# Patient Record
Sex: Male | Born: 1980 | Race: Black or African American | Hispanic: No | Marital: Single | Smoking: Never smoker
Health system: Southern US, Community
[De-identification: ages and names within clinical notes are randomized; demographics above are authoritative.]

## PROBLEM LIST (undated history)

## (undated) DIAGNOSIS — J45909 Unspecified asthma, uncomplicated: Secondary | ICD-10-CM

## (undated) HISTORY — DX: Unspecified asthma, uncomplicated: J45.909

## (undated) HISTORY — PX: TONSILLECTOMY: SUR1361

## (undated) HISTORY — PX: EYE SURGERY: SHX253

---

## 2000-10-20 ENCOUNTER — Emergency Department (HOSPITAL_COMMUNITY): Admission: EM | Admit: 2000-10-20 | Discharge: 2000-10-20 | Payer: Self-pay | Admitting: Emergency Medicine

## 2012-04-02 ENCOUNTER — Ambulatory Visit: Payer: Self-pay | Admitting: Physician Assistant

## 2013-10-21 ENCOUNTER — Other Ambulatory Visit: Payer: Self-pay

## 2013-10-21 ENCOUNTER — Encounter (HOSPITAL_COMMUNITY): Payer: Self-pay | Admitting: Emergency Medicine

## 2013-10-21 ENCOUNTER — Emergency Department (HOSPITAL_COMMUNITY): Payer: Non-veteran care

## 2013-10-21 ENCOUNTER — Emergency Department (HOSPITAL_COMMUNITY)
Admission: EM | Admit: 2013-10-21 | Discharge: 2013-10-21 | Disposition: A | Discharge status: Home / Self Care | Payer: Non-veteran care | Attending: Emergency Medicine | Admitting: Emergency Medicine

## 2013-10-21 DIAGNOSIS — R0789 Other chest pain: Secondary | ICD-10-CM

## 2013-10-21 LAB — I-STAT CHEM 8, ED
BUN: 8 mg/dL (ref 6–23)
Calcium, Ion: 1.16 mmol/L (ref 1.12–1.23)
Chloride: 101 mEq/L (ref 96–112)
Creatinine, Ser: 1.3 mg/dL (ref 0.50–1.35)
Glucose, Bld: 98 mg/dL (ref 70–99)
HEMATOCRIT: 44 % (ref 39.0–52.0)
HEMOGLOBIN: 15 g/dL (ref 13.0–17.0)
POTASSIUM: 3.7 meq/L (ref 3.7–5.3)
Sodium: 141 mEq/L (ref 137–147)
TCO2: 28 mmol/L (ref 0–100)

## 2013-10-21 LAB — I-STAT TROPONIN, ED: Troponin i, poc: 0 ng/mL (ref 0.00–0.08)

## 2013-10-21 NOTE — ED Notes (Signed)
Pt c/o right sided non radiating sharp chest pain, onset two hours ago. Pt denies any associated symptoms with the pain. Pt was sitting down when chest pain occurs. Pt A&Ox4, respirations equal and unlabored, skin warm and dry

## 2013-10-21 NOTE — ED Provider Notes (Signed)
CSN: [id number]     Arrival date & time 10/21/13  0031 History   First MD Initiated Contact with Patient 10/21/13 [phone]     Chief Complaint  Patient presents with  . Chest Pain     (Consider location/radiation/quality/duration/timing/severity/associated sxs/prior Treatment) HPI This patient is a generally healthy 33 year old man who had a self-limited episode of right-sided chest pain after he drank a bottle of magnesium citrate to induce a bowel movement. The pain was sharp, aching and radiated to the back. It began shortly before he came to the emergency department and resolved shortly after he was triaged. Nothing seemed to make the pain worse or better. At its worst, the pain was 7 on a 0-to-10 scale. The patient is pain free at this time. no history of similar pain.  The patient denies pleuritic pain. He has not had any cough or shortness of breath or fever. No abdominal pain, nausea or vomiting. He did have a large bowel movement following magnesium citrate.   History reviewed. No pertinent past medical history. History reviewed. No pertinent past surgical history. History reviewed. No pertinent family history. History  Substance Use Topics  . Smoking status: Never Smoker   . Smokeless tobacco: Not on file  . Alcohol Use: No    Review of Systems Ten point review of symptoms performed and is negative with the exception of symptoms noted above.    Allergies  Shellfish allergy  Home Medications   Prior to Admission medications   Not on File   BP 122/71  Pulse 53  Temp(Src) 98.3 F (36.8 C) (Oral)  Resp 18  Ht 5\' 6"  (1.676 m)  Wt 165 lb 12.8 oz (75.206 kg)  BMI 26.77 kg/m2  SpO2 100% Physical Exam Gen: well developed and well nourished appearing Head: NCAT Eyes: PERL, EOMI Nose: no epistaixis or rhinorrhea Mouth/throat: mucosa is moist and pink Neck: supple, no stridor Chest wall: nontender, no crepitus, normal to inspection.  Lungs: CTA B, no wheezing, rhonchi  or rales CV: RRR, no murmur, extremities appear well perfused.  Abd: soft, notender, nondistended Back: no ttp, no cva ttp Skin: warm and dry Ext: normal to inspection, no dependent edema Neuro: CN ii-xii grossly intact, no focal deficits Psyche; normal affect,  calm and cooperative.   ED Course  Procedures (including critical care time) Labs Review  Results for orders placed during the hospital encounter of 10/21/13 (from the past 24 hour(s))  I-STAT TROPOININ, ED     Status: None   Collection Time    10/21/13 12:54 AM      Result Value Ref Range   Troponin i, poc 0.00  0.00 - 0.08 ng/mL   Comment 3           I-STAT CHEM 8, ED     Status: None   Collection Time    10/21/13 12:57 AM      Result Value Ref Range   Sodium 141  137 - 147 mEq/L   Potassium 3.7  3.7 - 5.3 mEq/L   Chloride 101  96 - 112 mEq/L   BUN 8  6 - 23 mg/dL   Creatinine, Ser 4.131.30  0.50 - 1.35 mg/dL   Glucose, Bld 98  70 - 99 mg/dL   Calcium, Ion 2.441.16  0.101.12 - 1.23 mmol/L   TCO2 28  0 - 100 mmol/L   Hemoglobin 15.0  13.0 - 17.0 g/dL   HCT 27.244.0  53.639.0 - 64.452.0 %     Imaging Review  Dg Chest 2 View  10/21/2013   CLINICAL DATA:  Right chest pain.  EXAM: CHEST  2 VIEW  COMPARISON:  None.  FINDINGS: The heart size and mediastinal contours are within normal limits. Both lungs are clear. The visualized skeletal structures are unremarkable.  No pneumothorax.  IMPRESSION: No active cardiopulmonary disease.   Electronically Signed   By: Charlett NoseKevin  Dover M.D.   On: 10/21/2013 01:27    EKG: nsr, no acute ischemic changes, normal intervals, normal axis, normal qrs complex  MDM   Patient s/p self limited episode of atypical chest pain. VS wnl on monitor and patient has been pain free for the past several hours he has been waiting to be seen. We have ruled out ptx, pleural effusion and pna with CXR. Patient is stable for d/c with referral for outpatient f/u.     Brandt LoosenJulie Manly, MD 10/21/13 [phone]

## 2014-03-09 ENCOUNTER — Ambulatory Visit (INDEPENDENT_AMBULATORY_CARE_PROVIDER_SITE_OTHER): Payer: BC Managed Care – PPO | Admitting: Physician Assistant

## 2014-03-09 VITALS — BP 114/74 | HR 55 | Temp 98.0°F | Resp 20 | Ht 66.0 in | Wt 167.6 lb

## 2014-03-09 DIAGNOSIS — Z113 Encounter for screening for infections with a predominantly sexual mode of transmission: Secondary | ICD-10-CM

## 2014-03-09 DIAGNOSIS — Z23 Encounter for immunization: Secondary | ICD-10-CM

## 2014-03-09 DIAGNOSIS — R300 Dysuria: Secondary | ICD-10-CM

## 2014-03-09 LAB — POCT URINALYSIS DIPSTICK
Bilirubin, UA: NEGATIVE
GLUCOSE UA: NEGATIVE
Ketones, UA: NEGATIVE
LEUKOCYTES UA: NEGATIVE
Nitrite, UA: NEGATIVE
Protein, UA: NEGATIVE
SPEC GRAV UA: 1.01
UROBILINOGEN UA: 0.2
pH, UA: 7.5

## 2014-03-09 LAB — POCT UA - MICROSCOPIC ONLY
Bacteria, U Microscopic: NEGATIVE
Casts, Ur, LPF, POC: NEGATIVE
Crystals, Ur, HPF, POC: NEGATIVE
Mucus, UA: NEGATIVE
WBC, Ur, HPF, POC: NEGATIVE
Yeast, UA: NEGATIVE

## 2014-03-09 NOTE — Patient Instructions (Addendum)
I will contact you with your lab results as soon as they are available.   If you have not heard from me in 2 weeks, please contact me.  The fastest way to get your results is to register for My Chart (see the instructions on the last page of this printout).   Influenza Vaccine (Flu Vaccine, Inactivated or Recombinant) 2014-2015: What You Need to Know 1. Why get vaccinated? Influenza ("flu") is a contagious disease that spreads around the [place] every winter, usually between October and May. Flu is caused by influenza viruses, and is spread mainly by coughing, sneezing, and close contact. Anyone can get flu, but the risk of getting flu is highest among children. Symptoms come on suddenly and may last several days. They can include:  fever/chills  sore throat  muscle aches  fatigue  cough  headache  runny or stuffy nose Flu can make some people much sicker than others. These people include young children, people 65 and older, pregnant women, and people with certain health conditions-such as heart, lung or kidney disease, nervous system disorders, or a weakened immune system. Flu vaccination is especially important for these people, and anyone in close contact with them. Flu can also lead to pneumonia, and make existing medical conditions worse. It can cause diarrhea and seizures in children. Each year thousands of people in the [place] die from flu, and many more are hospitalized. Flu vaccine is the best protection against flu and its complications. Flu vaccine also helps prevent spreading flu from person to person. 2. Inactivated and recombinant flu vaccines You are getting an injectable flu vaccine, which is either an "inactivated" or "recombinant" vaccine. These vaccines do not contain any live influenza virus. They are given by injection with a needle, and often called the "flu shot."  A different live, attenuated (weakened) influenza vaccine is sprayed into the nostrils.  This vaccine is described in a separate Vaccine Information Statement. Flu vaccination is recommended every year. Some children 6 months through 8 years of age might need two doses during one year. Flu viruses are always changing. Each year's flu vaccine is made to protect against 3 or 4 viruses that are likely to cause disease that year. Flu vaccine cannot prevent all cases of flu, but it is the best defense against the disease.  It takes about 2 weeks for protection to develop after the vaccination, and protection lasts several months to a year. Some illnesses that are not caused by influenza virus are often mistaken for flu. Flu vaccine will not prevent these illnesses. It can only prevent influenza. Some inactivated flu vaccine contains a very small amount of a mercury-based preservative called thimerosal. Studies have shown that thimerosal in vaccines is not harmful, but flu vaccines that do not contain a preservative are available. 3. Some people should not get this vaccine Tell the person who gives you the vaccine:  If you have any severe, life-threatening allergies. If you ever had a life-threatening allergic reaction after a dose of flu vaccine, or have a severe allergy to any part of this vaccine, including (for example) an allergy to gelatin, antibiotics, or eggs, you may be advised not to get vaccinated. Most, but not all, types of flu vaccine contain a small amount of egg protein.  If you ever had Guillain-Barr Syndrome (a severe paralyzing illness, also called GBS). Some people with a history of GBS should not get this vaccine. This should be discussed with your doctor.  If you   are not feeling well. It is usually okay to get flu vaccine when you have a mild illness, but you might be advised to wait until you feel better. You should come back when you are better. 4. Risks of a vaccine reaction With a vaccine, like any medicine, there is a chance of side effects. These are usually mild  and go away on their own. Problems that could happen after any vaccine:  Brief fainting spells can happen after any medical procedure, including vaccination. Sitting or lying down for about 15 minutes can help prevent fainting, and injuries caused by a fall. Tell your doctor if you feel dizzy, or have vision changes or ringing in the ears.  Severe shoulder pain and reduced range of motion in the arm where a shot was given can happen, very rarely, after a vaccination.  Severe allergic reactions from a vaccine are very rare, estimated at less than 1 in a million doses. If one were to occur, it would usually be within a few minutes to a few hours after the vaccination. Mild problems following inactivated flu vaccine:  soreness, redness, or swelling where the shot was given  hoarseness  sore, red or itchy eyes  cough  fever  aches  headache  itching  fatigue If these problems occur, they usually begin soon after the shot and last 1 or 2 days. Moderate problems following inactivated flu vaccine:  Young children who get inactivated flu vaccine and pneumococcal vaccine (PCV13) at the same time may be at increased risk for seizures caused by fever. Ask your doctor for more information. Tell your doctor if a child who is getting flu vaccine has ever had a seizure. Inactivated flu vaccine does not contain live flu virus, so you cannot get the flu from this vaccine. As with any medicine, there is a very remote chance of a vaccine causing a serious injury or death. The safety of vaccines is always being monitored. For more information, visit: www.cdc.gov/vaccinesafety/ 5. What if there is a serious reaction? What should I look for?  Look for anything that concerns you, such as signs of a severe allergic reaction, very high fever, or behavior changes. Signs of a severe allergic reaction can include hives, swelling of the face and throat, difficulty breathing, a fast heartbeat, dizziness, and  weakness. These would start a few minutes to a few hours after the vaccination. What should I do?  If you think it is a severe allergic reaction or other emergency that can't wait, call 9-1-1 and get the person to the nearest hospital. Otherwise, call your doctor.  Afterward, the reaction should be reported to the Vaccine Adverse Event Reporting System (VAERS). Your doctor should file this report, or you can do it yourself through the VAERS web site at www.vaers.hhs.gov, or by calling 1-800-822-7967. VAERS does not give medical advice. 6. The National Vaccine Injury Compensation Program The National Vaccine Injury Compensation Program (VICP) is a federal program that was created to compensate people who may have been injured by certain vaccines. Persons who believe they may have been injured by a vaccine can learn about the program and about filing a claim by calling 1-800-338-2382 or visiting the VICP website at www.hrsa.gov/vaccinecompensation. There is a time limit to file a claim for compensation. 7. How can I learn more?  Ask your health care provider.  Call your local or state health department.  Contact the Centers for Disease Control and Prevention (CDC):  Call 1-800-232-4636 (1-800-CDC-INFO) or  Visit   CDC's website at www.cdc.gov/flu CDC Vaccine Information Statement (Interim) Inactivated Influenza Vaccine (02/20/2013) Document Released: 04/15/2006 Document Revised: 11/05/2013 Document Reviewed: 06/08/2013 ExitCare Patient Information 2015 [employer]. This information is not intended to replace advice given to you by your health care provider. Make sure you discuss any questions you have with your health care provider.  

## 2014-03-09 NOTE — Progress Notes (Signed)
Subjective:    Patient ID: Keith Buchanan, male    DOB: [birth year], 33 y.o.   MRN: [id number]   PCP: No primary provider on file.  Chief Complaint  Patient presents with  . Burning with urination    X 2-3 days      Active Ambulatory Problems    Diagnosis Date Noted  . No Active Ambulatory Problems   Resolved Ambulatory Problems    Diagnosis Date Noted  . No Resolved Ambulatory Problems   Past Medical History  Diagnosis Date  . Asthma     Past Surgical History  Procedure Laterality Date  . Eye surgery    . Tonsillectomy      Allergies  Allergen Reactions  . Shellfish Allergy Swelling    Facial sweling    Prior to Admission medications   Not on File    History   Social History  . Marital Status: Single    Spouse Name: n/a    Number of Children: 4  . Years of Education: 15   Occupational History  . [employer] doctor    Social History Main Topics  . Smoking status: Never Smoker   . Smokeless tobacco: Never Used  . Alcohol Use: Yes     Comment: 1-2 times/month  . Drug Use: No  . Sexual Activity: Yes    Partners: Female   Other Topics Concern  . None   Social History Narrative   Lives his girlfriend. Two children live in [place], one son lives in [place], one son lives in [place].    family history includes Asthma in his son; Cancer in his maternal grandfather; Diabetes in his father, maternal grandmother, and mother; Heart disease in his paternal grandfather; Hyperlipidemia in his father; Stroke in his paternal grandmother. indicated that his mother is alive. He indicated that his father is alive. He indicated that his daughter is alive. He indicated that all of his three sons are alive.   HPI  This 33 y.o. male presents for evaluation of dysuria x 2-3 days. "I know it ain't no damn disease." Accidentally used conditioner instead of lotion for lubrication during masturbation, and then developed a burning sensation at the very tip of the penis. The  symptoms resolved yesterday. No urinary urgency or frequency. No blood in the urine. No penile discharge, swollen or tender lymph nodes, skin lesions of the genitalia, groin, buttocks or upper thighs. His girlfriend is not having any symptoms, though recently had a UTI. She is here today for a follow-up visit. Monogamous. He was last tested for STI about a year ago. No history of STI.  Review of Systems As above.    Objective:   Physical Exam  Constitutional: He is oriented to person, place, and time. He appears well-developed and well-nourished. No distress.  Eyes: Conjunctivae are normal. No scleral icterus.  Neck: Neck supple. No thyromegaly present.  Cardiovascular: Normal rate, regular rhythm and normal heart sounds.   Pulmonary/Chest: Effort normal and breath sounds normal.  Lymphadenopathy:    He has no cervical adenopathy.  Neurological: He is alert and oriented to person, place, and time.  Skin: Skin is warm and dry.  Psychiatric: He has a normal mood and affect. His speech is normal and behavior is normal.   Results for orders placed in visit on 03/09/14  POCT UA - MICROSCOPIC ONLY      Result Value Ref Range   WBC, Ur, HPF, POC neg     RBC, urine, microscopic 0-1  Bacteria, U Microscopic neg     Mucus, UA neg     Epithelial cells, urine per micros 0-1     Crystals, Ur, HPF, POC neg     Casts, Ur, LPF, POC neg     Yeast, UA neg    POCT URINALYSIS DIPSTICK      Result Value Ref Range   Color, UA yellow     Clarity, UA clear     Glucose, UA neg     Bilirubin, UA neg     Ketones, UA neg     Spec Grav, UA 1.010     Blood, UA trace-intact     pH, UA 7.5     Protein, UA neg     Urobilinogen, UA 0.2     Nitrite, UA neg     Leukocytes, UA Negative            Assessment & Plan:  1. Dysuria No evidence of UTI. Await GC/CT results. - POCT UA - Microscopic Only - POCT urinalysis dipstick - GC/Chlamydia Probe Amp  2. Routine screening for STI (sexually  transmitted infection) - Hepatitis B surface antigen - Hepatitis C antibody - Hepatitis B surface antibody - HSV(herpes simplex vrs) 1+2 ab-IgG - HIV antibody - RPR  3. Need for influenza vaccination - Flu Vaccine QUAD 36+ mos IM   Fernande Bras, PA-C Physician Assistant-Certified [facility]

## 2014-03-10 LAB — RPR

## 2014-03-10 LAB — HIV ANTIBODY (ROUTINE TESTING W REFLEX): HIV 1&2 Ab, 4th Generation: NONREACTIVE

## 2014-03-10 LAB — HEPATITIS B SURFACE ANTIBODY, QUANTITATIVE: Hepatitis B-Post: 53.7 m[IU]/mL

## 2014-03-10 LAB — HEPATITIS B SURFACE ANTIGEN: HEP B S AG: NEGATIVE

## 2014-03-10 LAB — HEPATITIS C ANTIBODY: HCV AB: NEGATIVE

## 2014-03-11 ENCOUNTER — Encounter: Payer: Self-pay | Admitting: Physician Assistant

## 2014-03-11 DIAGNOSIS — Z789 Other specified health status: Secondary | ICD-10-CM | POA: Insufficient documentation

## 2014-03-12 LAB — HSV(HERPES SIMPLEX VRS) I + II AB-IGG
HSV 1 GLYCOPROTEIN G AB, IGG: 3.94 IV — AB
HSV 2 Glycoprotein G Ab, IgG: <0.1 IV

## 2014-03-13 LAB — GC/CHLAMYDIA PROBE AMP
CT Probe RNA: NEGATIVE
GC Probe RNA: NEGATIVE

## 2014-03-26 IMAGING — CR DG KNEE COMPLETE 4+V*R*
1 series · 5 of 5 positions shown · non-contrast
Comparison: none

REASON FOR EXAM: R knee pain after fall
COMMENTS:

PROCEDURE:     MDR - MDR KNEE RT COMPLETE W/OBLIQUES  - April 02, 2012  [DATE]
RESULT:     Comparison:  None

[Series 1: ap · 0.17mm/px · 5 of 5 slices shown]
[im 1/5]
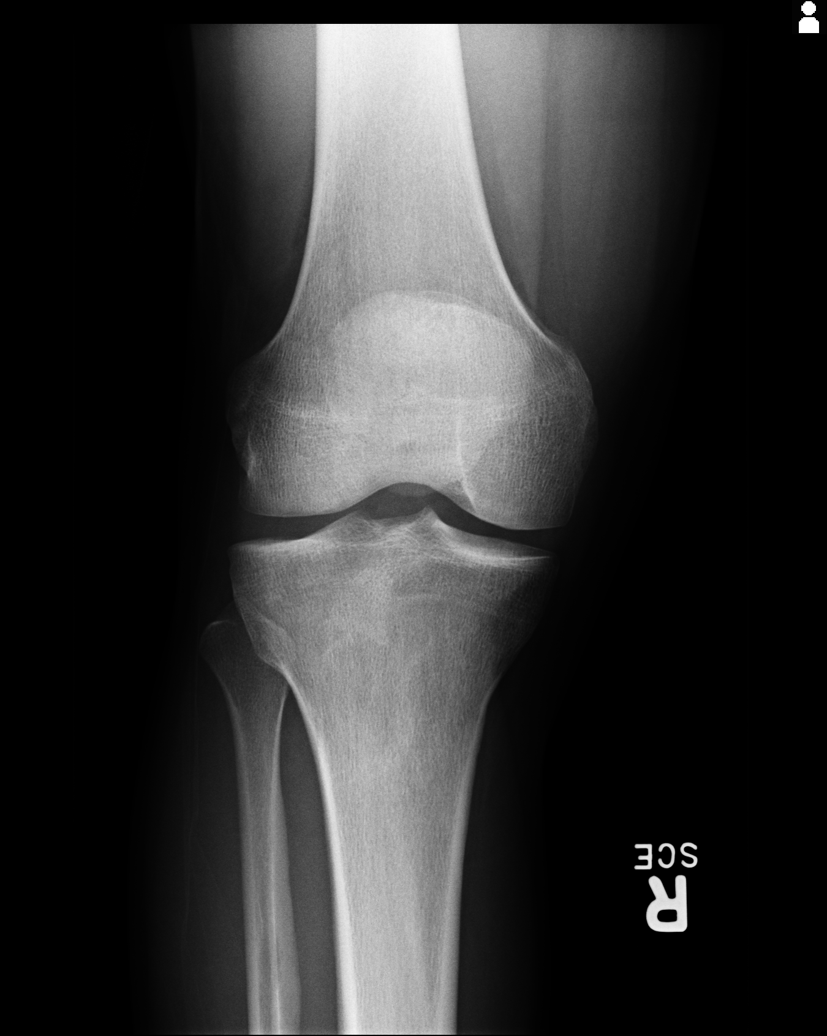
[im 2/5]
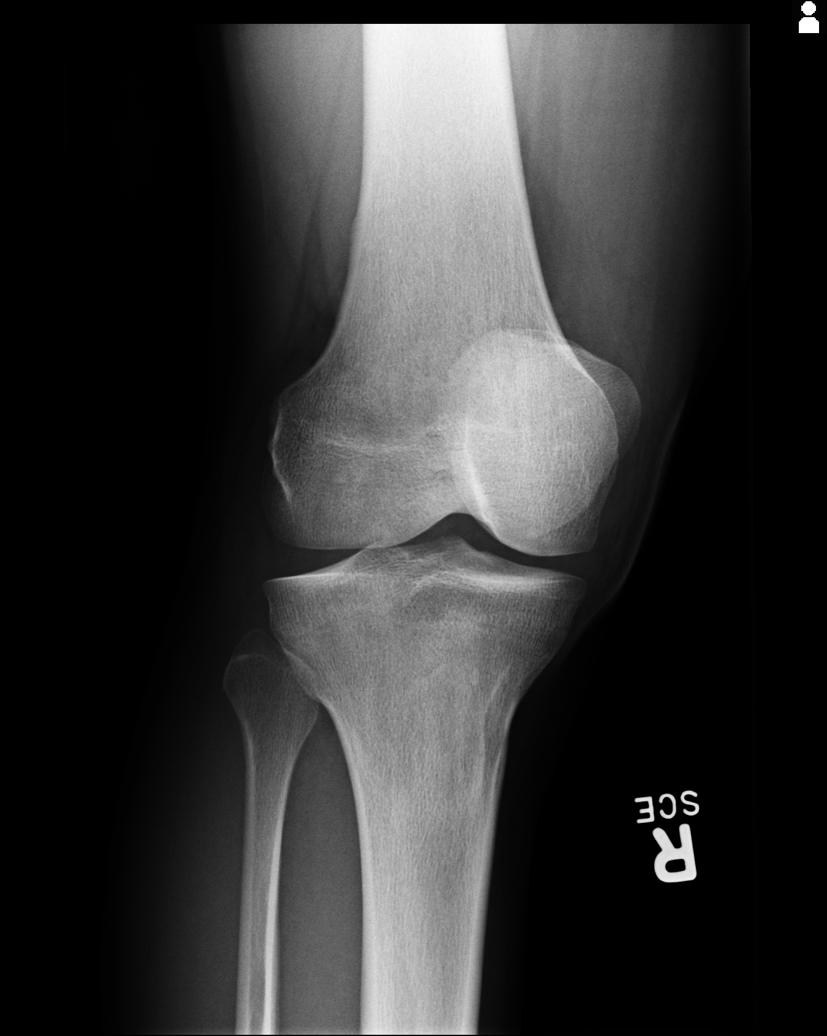
[im 3/5]
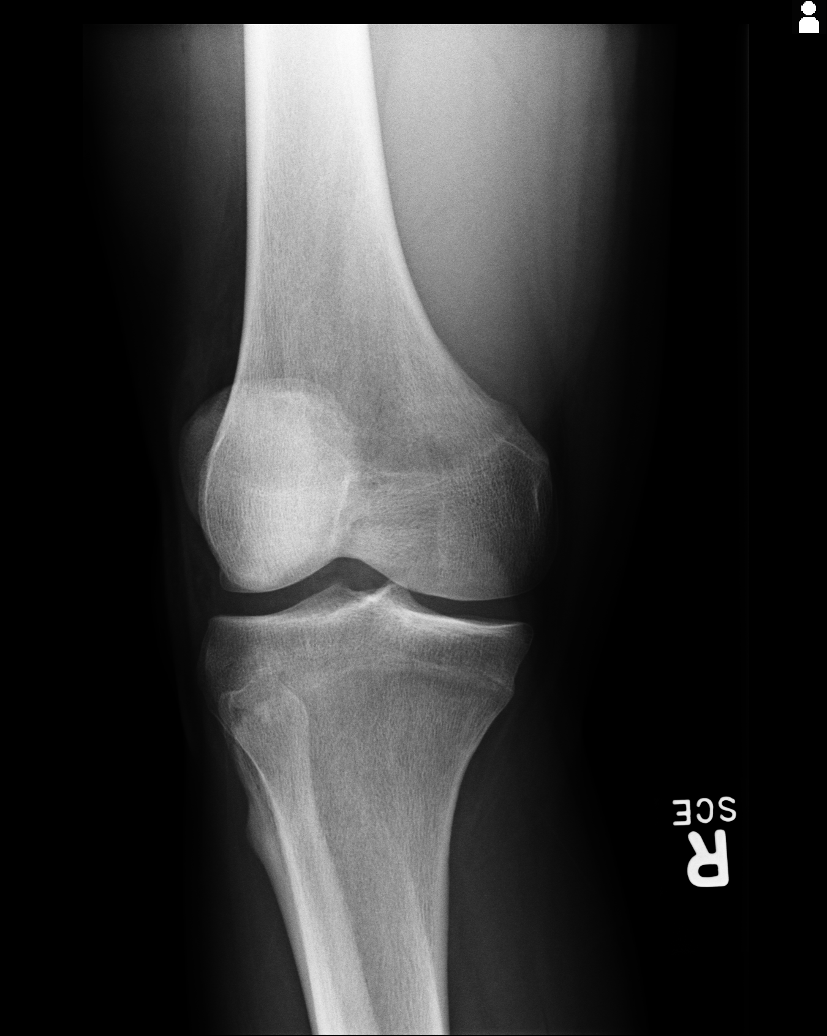
[im 4/5]
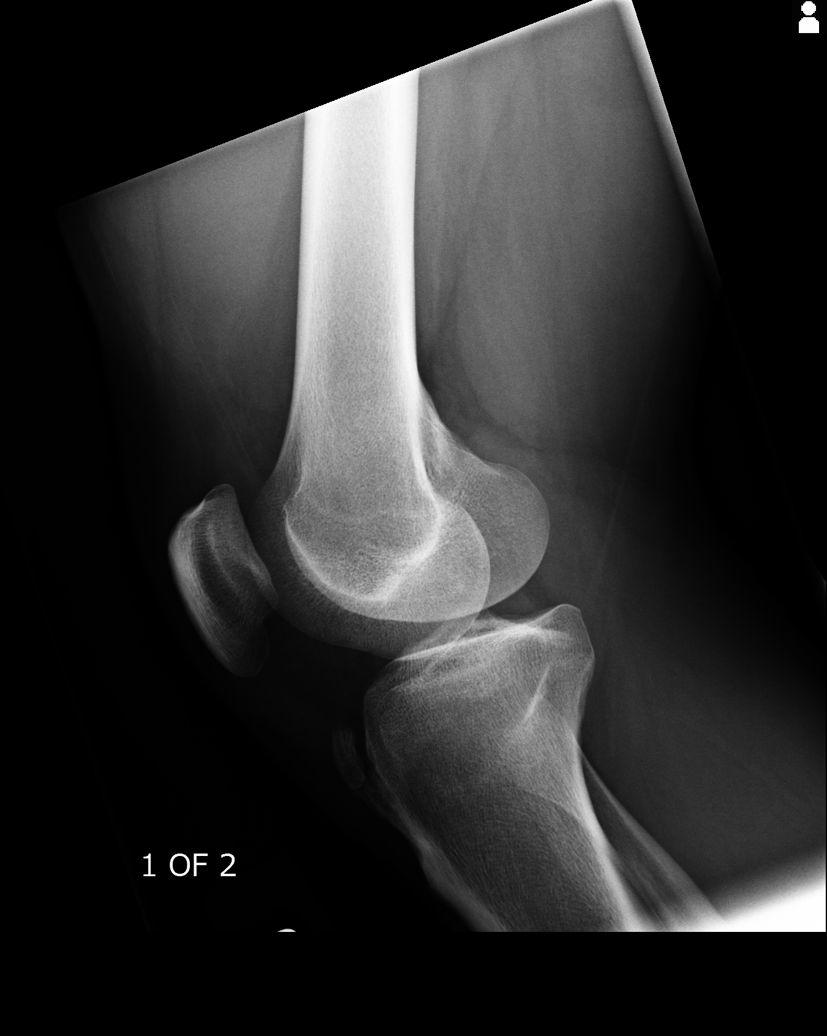
[im 5/5]
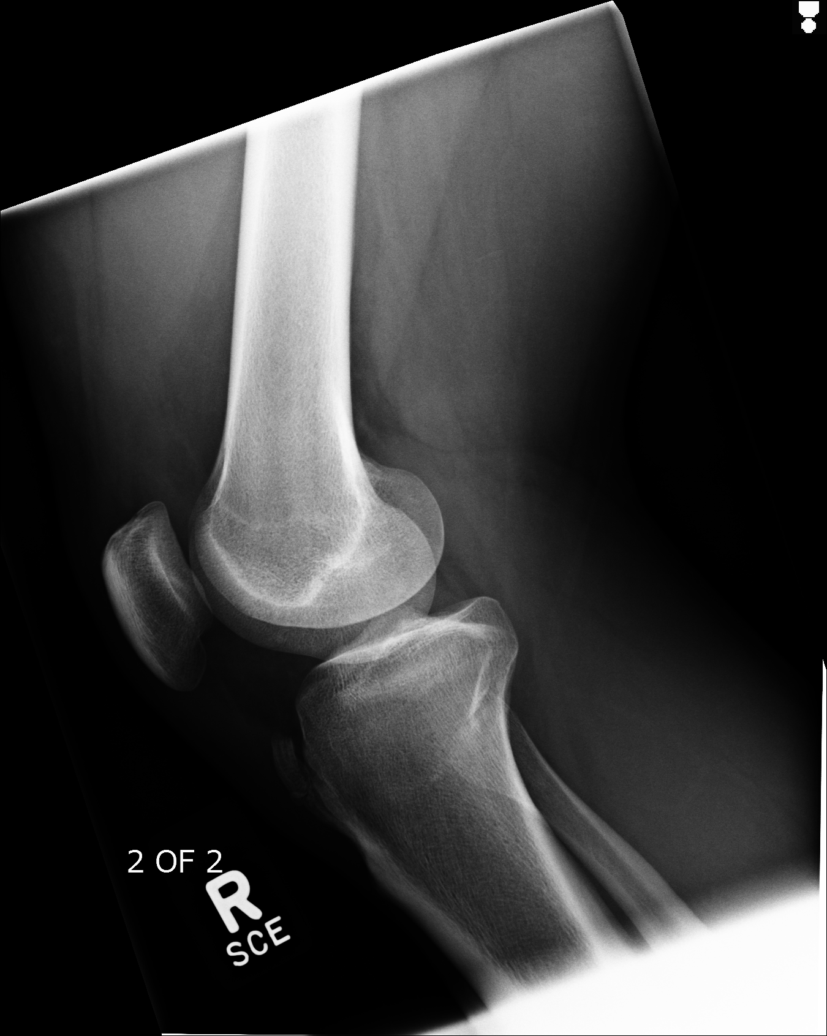

[5 of 5 positions shown; findings below may reference images not displayed]

FINDINGS: 4 views of the right knee demonstrates no acute fracture or dislocation.
There is no significant joint effusion.
IMPRESSION: No acute osseous injury of the right knee.

[REDACTED]

## 2014-04-29 ENCOUNTER — Ambulatory Visit (INDEPENDENT_AMBULATORY_CARE_PROVIDER_SITE_OTHER): Payer: BC Managed Care – PPO | Admitting: Internal Medicine

## 2014-04-29 VITALS — BP 110/72 | HR 68 | Temp 97.7°F | Resp 18 | Ht 67.0 in | Wt 168.0 lb

## 2014-04-29 DIAGNOSIS — Z13220 Encounter for screening for lipoid disorders: Secondary | ICD-10-CM

## 2014-04-29 DIAGNOSIS — Z0000 Encounter for general adult medical examination without abnormal findings: Secondary | ICD-10-CM

## 2014-04-29 LAB — POCT CBC
Granulocyte percent: 59.2 %G (ref 37–80)
HCT, POC: 46.9 % (ref 43.5–53.7)
Hemoglobin: 15 g/dL (ref 14.1–18.1)
LYMPH, POC: 1.2 (ref 0.6–3.4)
MCH: 29.2 pg (ref 27–31.2)
MCHC: 32.1 g/dL (ref 31.8–35.4)
MCV: 90.9 fL (ref 80–97)
MID (CBC): 0.1 (ref 0–0.9)
MPV: 7.8 fL (ref 0–99.8)
PLATELET COUNT, POC: 203 10*3/uL (ref 142–424)
POC Granulocyte: 2 (ref 2–6.9)
POC LYMPH PERCENT: 36.5 %L (ref 10–50)
POC MID %: 4.3 % (ref 0–12)
RBC: 5.16 M/uL (ref 4.69–6.13)
RDW, POC: 14 %
WBC: 3.3 10*3/uL — AB (ref 4.6–10.2)

## 2014-04-29 LAB — POCT URINALYSIS DIPSTICK
Bilirubin, UA: NEGATIVE
GLUCOSE UA: NEGATIVE
Ketones, UA: NEGATIVE
Leukocytes, UA: NEGATIVE
Nitrite, UA: NEGATIVE
PH UA: 5.5
Protein, UA: NEGATIVE
RBC UA: NEGATIVE
SPEC GRAV UA: 1.02
UROBILINOGEN UA: 1

## 2014-04-29 NOTE — Progress Notes (Signed)
Subjective:    Patient ID: Keith Buchanan, male    DOB: 02/14/1981, 33 y.o.   MRN: [id number]  HPI 33 year old male [facility] [place] [facility] [facility] [place] Pt is in good health. He has no health concerns. 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 No health problems. He had laser eye surgery ten years ago; otherwise no surgeries. Pt takes no medications. 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  Pt agreed on doing blood work and having urine checked for part of annual exam. Pt declined STD because; just had testing done a month ago.  Pt regularly exercises. No fainting or dizzy spells while exercising. Used to have asthma until he was 13.   Pt does not smoke.    Pt declined flu vaccine.    Review of Systems  Constitutional: Negative.   HENT: Negative.   Respiratory: Negative.   Cardiovascular: Negative.   Gastrointestinal: Negative.   Endocrine: Positive for polyphagia.       Objective:   Physical Exam  Constitutional: He is oriented to person, place, and time. He appears well-developed and well-nourished.  HENT:  Head: Normocephalic and atraumatic.  Right Ear: External ear normal.  Left Ear: External ear normal.  Mouth/Throat: Oropharynx is clear and moist.  Eyes: Conjunctivae and EOM are normal. Pupils are equal, round, and reactive to light.  Neck: Normal range of motion. Neck supple. No tracheal deviation present. No thyromegaly present.  Cardiovascular: Normal rate, regular rhythm and normal heart sounds.   Pulmonary/Chest: Effort normal and breath sounds normal.  Abdominal: Soft. Bowel sounds are normal. Hernia confirmed negative in the right inguinal area and confirmed negative in the left inguinal area.  Genitourinary: Testes normal and penis normal. Circumcised.  Musculoskeletal: Normal range of motion.  Lymphadenopathy:    He has no cervical adenopathy.       Right: No inguinal adenopathy  present.       Left: No inguinal adenopathy present.  Neurological: He is alert and oriented to person, place, and time. Coordination normal.  Skin: Skin is warm and dry.  Psychiatric: He has a normal mood and affect. His behavior is normal. Judgment and thought content normal.   Results for orders placed in visit on 04/29/14  POCT CBC      Result Value Ref Range   WBC 3.3 (*) 4.6 - 10.2 K/uL   Lymph, poc 1.2  0.6 - 3.4   POC LYMPH PERCENT 36.5  10 - 50 %L   MID (cbc) 0.1  0 - 0.9   POC MID % 4.3  0 - 12 %M   POC Granulocyte 2.0  2 - 6.9   Granulocyte percent 59.2  37 - 80 %G   RBC 5.16  4.69 - 6.13 M/uL   Hemoglobin 15.0  14.1 - 18.1 g/dL   HCT, POC 11.946.9  14.743.5 - 53.7 %   MCV 90.9  80 - 97 fL   MCH, POC 29.2  27 - 31.2 pg   MCHC 32.1  31.8 - 35.4 g/dL   RDW, POC 82.914.0     Platelet Count, POC 203  142 - 424 K/uL   MPV 7.8  0 - 99.8 fL  POCT URINALYSIS DIPSTICK      Result Value Ref Range   Color, UA yellow     Clarity, UA clear     Glucose, UA neg     Bilirubin, UA neg  Ketones, UA neg     Spec Grav, UA 1.020     Blood, UA neg     pH, UA 5.5     Protein, UA neg     Urobilinogen, UA 1.0     Nitrite, UA neg     Leukocytes, UA Negative            Assessment & Plan:  Healthy and fit

## 2014-04-29 NOTE — Patient Instructions (Signed)
Immunization Schedule, Adult  Influenza vaccine.  All adults should be immunized every year.  All adults, including pregnant women and people with hives-only allergy to eggs can receive the inactivated influenza (IIV) vaccine.  Adults aged 34-49 years can receive the recombinant influenza (RIV) vaccine. The RIV vaccine does not contain any egg protein.  Adults aged 76 years or older can receive the standard-dose IIV or the high-dose IIV.  Tetanus, diphtheria, and acellular pertussis (Td, Tdap) vaccine.  Pregnant women should receive 1 dose of Tdap vaccine during each pregnancy. The dose should be obtained regardless of the length of time since the last dose. Immunization is preferred during the 27th to 36th week of gestation.  An adult who has not previously received Tdap or who does not know his or her vaccine status should receive 1 dose of Tdap. This initial dose should be followed by tetanus and diphtheria toxoids (Td) booster doses every 10 years.  Adults with an unknown or incomplete history of completing a 3-dose immunization series with Td-containing vaccines should begin or complete a primary immunization series including a Tdap dose.  Adults should receive a Td booster every 10 years.  Varicella vaccine.  An adult without evidence of immunity to varicella should receive 2 doses or a second dose if he or she has previously received 1 dose.  Pregnant females who do not have evidence of immunity should receive the first dose after pregnancy. This first dose should be obtained before leaving the health care facility. The second dose should be obtained 4-8 weeks after the first dose.  Human papillomavirus (HPV) vaccine.  Females aged 13-26 years who have not received the vaccine previously should obtain the 3-dose series.  The vaccine is not recommended for use in pregnant females. However, pregnancy testing is not needed before receiving a dose. If a male is found to be  pregnant after receiving a dose, no treatment is needed. In that case, the remaining doses should be delayed until after the pregnancy.  Males aged 37-21 years who have not received the vaccine previously should receive the 3-dose series. Males aged 22-26 years may be immunized.  Immunization is recommended through the age of 93 years for any male who has sex with males and did not get any or all doses earlier.  Immunization is recommended for any person with an immunocompromised condition through the age of 71 years if he or she did not get any or all doses earlier.  During the 3-dose series, the second dose should be obtained 4-8 weeks after the first dose. The third dose should be obtained 24 weeks after the first dose and 16 weeks after the second dose.  Zoster vaccine.  One dose is recommended for adults aged 73 years or older unless certain conditions are present.  Measles, mumps, and rubella (MMR) vaccine.  Adults born before 35 generally are considered immune to measles and mumps.  Adults born in [birth year] or later should have 1 or more doses of MMR vaccine unless there is a contraindication to the vaccine or there is laboratory evidence of immunity to each of the three diseases.  A routine second dose of MMR vaccine should be obtained at least 28 days after the first dose for students attending postsecondary schools, health care workers, or international travelers.  People who received inactivated measles vaccine or an unknown type of measles vaccine during 1963-1967 should receive 2 doses of MMR vaccine.  People who received inactivated mumps vaccine or an unknown type  of mumps vaccine before 1979 and are at high risk for mumps infection should consider immunization with 2 doses of MMR vaccine.  For females of childbearing age, rubella immunity should be determined. If there is no evidence of immunity, females who are not pregnant should be vaccinated. If there is no evidence of  immunity, females who are pregnant should delay immunization until after pregnancy.  Unvaccinated health care workers born before 1957 who lack laboratory evidence of measles, mumps, or rubella immunity or laboratory confirmation of disease should consider measles and mumps immunization with 2 doses of MMR vaccine or rubella immunization with 1 dose of MMR vaccine.  Pneumococcal 13-valent conjugate (PCV13) vaccine.  When indicated, a person who is uncertain of his or her immunization history and has no record of immunization should receive the PCV13 vaccine.  An adult aged 19 years or older who has certain medical conditions and has not been previously immunized should receive 1 dose of PCV13 vaccine. This PCV13 should be followed with a dose of pneumococcal polysaccharide (PPSV23) vaccine. The PPSV23 vaccine dose should be obtained at least 8 weeks after the dose of PCV13 vaccine.  An adult aged 19 years or older who has certain medical conditions and previously received 1 or more doses of PPSV23 vaccine should receive 1 dose of PCV13. The PCV13 vaccine dose should be obtained 1 or more years after the last PPSV23 vaccine dose.  Pneumococcal polysaccharide (PPSV23) vaccine.  When PCV13 is also indicated, PCV13 should be obtained first.  All adults aged 65 years and older should be immunized.  An adult younger than age 65 years who has certain medical conditions should be immunized.  Any person who resides in a nursing home or long-term care facility should be immunized.  An adult smoker should be immunized.  People with an immunocompromised condition and certain other conditions should receive both PCV13 and PPSV23 vaccines.  People with human immunodeficiency virus (HIV) infection should be immunized as soon as possible after diagnosis.  Immunization during chemotherapy or radiation therapy should be avoided.  Routine use of PPSV23 vaccine is not recommended for American Indians,  [place]atives, or people younger than 65 years unless there are medical conditions that require PPSV23 vaccine.  When indicated, people who have unknown immunization and have no record of immunization should receive PPSV23 vaccine.  One-time revaccination 5 years after the first dose of PPSV23 is recommended for people aged 19-64 years who have chronic kidney failure, nephrotic syndrome, asplenia, or immunocompromised conditions.  People who received 1-2 doses of PPSV23 before age 65 years should receive another dose of PPSV23 vaccine at age 65 years or later if at least 5 years have passed since the previous dose.  Doses of PPSV23 are not needed for people immunized with PPSV23 at or after age 65 years.  Meningococcal vaccine.  Adults with asplenia or persistent complement component deficiencies should receive 2 doses of quadrivalent meningococcal conjugate (MenACWY-D) vaccine. The doses should be obtained at least 2 months apart.  Microbiologists working with certain meningococcal bacteria, military recruits, people at risk during an outbreak, and people who travel to or live in countries with a high rate of meningitis should be immunized.  A first-year college student up through age 21 years who is living in a residence hall should receive a dose if he or she did not receive a dose on or after his or her 16th birthday.  Adults who have certain high-risk conditions should receive one or more doses   of vaccine.  Hepatitis A vaccine.  Adults who wish to be protected from this disease, have certain high-risk conditions, work with hepatitis A-infected animals, work in hepatitis A research labs, or travel to or work in countries with a high rate of hepatitis A should be immunized.  Adults who were previously unvaccinated and who anticipate close contact with an international adoptee during the first 60 days after arrival in the [place] from a country with a high rate of hepatitis A should  be immunized.  Hepatitis B vaccine.  Adults who wish to be protected from this disease, have certain high-risk conditions, may be exposed to blood or other infectious body fluids, are household contacts or sex partners of hepatitis B positive people, are clients or workers in certain care facilities, or travel to or work in countries with a high rate of hepatitis B should be immunized.  Haemophilus influenzae type b (Hib) vaccine.  A previously unvaccinated person with asplenia or sickle cell disease or having a scheduled splenectomy should receive 1 dose of Hib vaccine.  Regardless of previous immunization, a recipient of a hematopoietic stem cell transplant should receive a 3-dose series 6-12 months after his or her successful transplant.  Hib vaccine is not recommended for adults with HIV infection. Document Released: 09/11/2003 Document Revised: 10/16/2012 Document Reviewed: 08/08/2012 [employer] Patient Information 2015 [employer]. This information is not intended to replace advice given to you by your health care provider. Make sure you discuss any questions you have with your health care provider.  

## 2014-04-29 NOTE — Progress Notes (Deleted)
   Subjective:    Patient ID: Keith Buchanan, male    DOB: 01/26/1981, 33 y.o.   MRN: [id number]  HPI    Review of Systems     Objective:   Physical Exam        Assessment & Plan:

## 2014-04-30 LAB — COMPREHENSIVE METABOLIC PANEL
ALT: 13 U/L (ref 0–53)
AST: 18 U/L (ref 0–37)
Albumin: 4.7 g/dL (ref 3.5–5.2)
Alkaline Phosphatase: 45 U/L (ref 39–117)
BUN: 8 mg/dL (ref 6–23)
CALCIUM: 9.3 mg/dL (ref 8.4–10.5)
CO2: 26 mEq/L (ref 19–32)
CREATININE: 0.96 mg/dL (ref 0.50–1.35)
Chloride: 103 mEq/L (ref 96–112)
GLUCOSE: 82 mg/dL (ref 70–99)
POTASSIUM: 4.4 meq/L (ref 3.5–5.3)
Sodium: 140 mEq/L (ref 135–145)
Total Bilirubin: 0.6 mg/dL (ref 0.2–1.2)
Total Protein: 7.2 g/dL (ref 6.0–8.3)

## 2014-04-30 LAB — LIPID PANEL
CHOLESTEROL: 231 mg/dL — AB (ref 0–200)
HDL: 50 mg/dL (ref >39)
LDL Cholesterol: 165 mg/dL — ABNORMAL HIGH (ref 0–99)
TRIGLYCERIDES: 80 mg/dL (ref <150)
Total CHOL/HDL Ratio: 4.6 Ratio
VLDL: 16 mg/dL (ref 0–40)

## 2014-04-30 LAB — TSH: TSH: 0.599 u[IU]/mL (ref 0.350–4.500)

## 2015-10-14 IMAGING — CR DG CHEST 2V
2 series · 2 of 2 positions shown · non-contrast
Comparison: None.

CLINICAL DATA: Right chest pain.

EXAM:
CHEST  2 VIEW

[w chest pa]
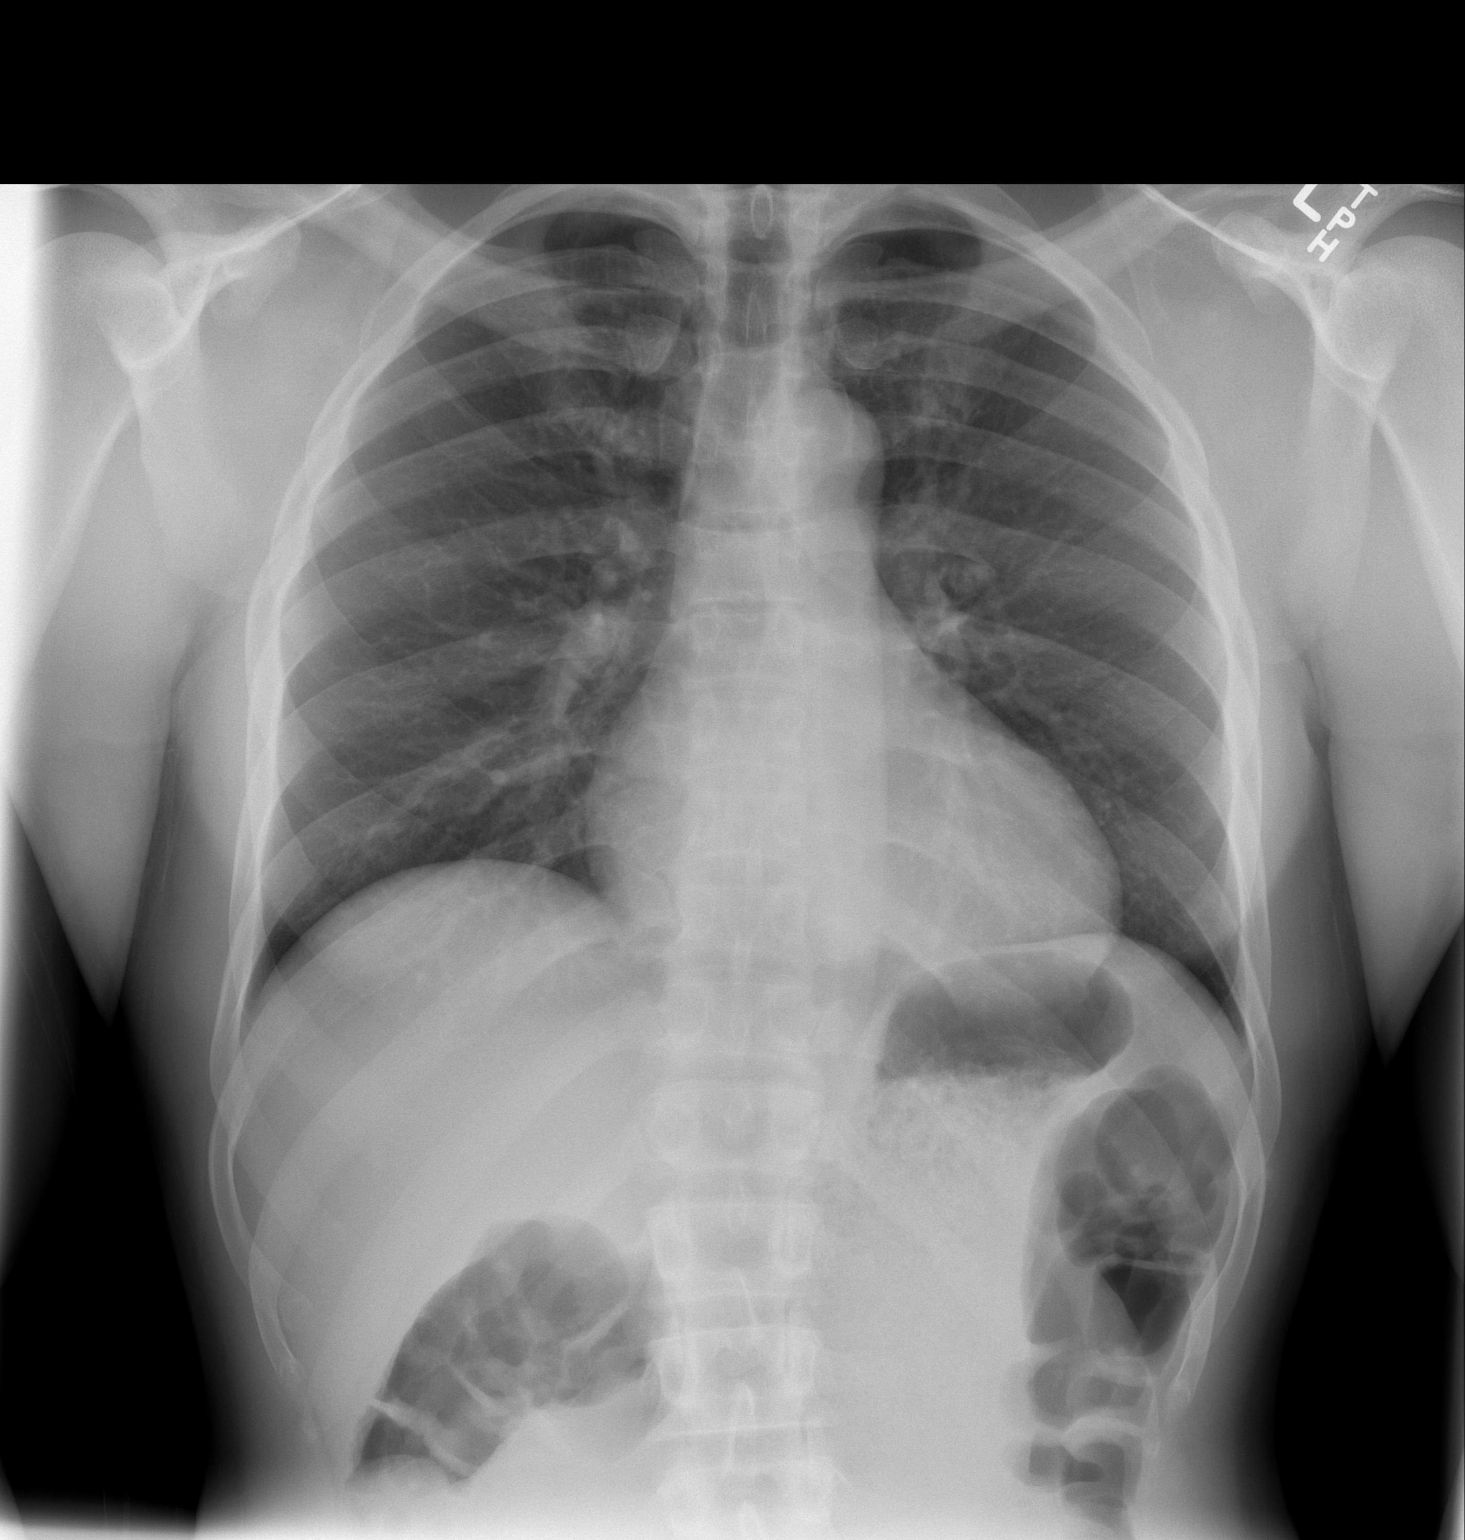

[w chest lat]
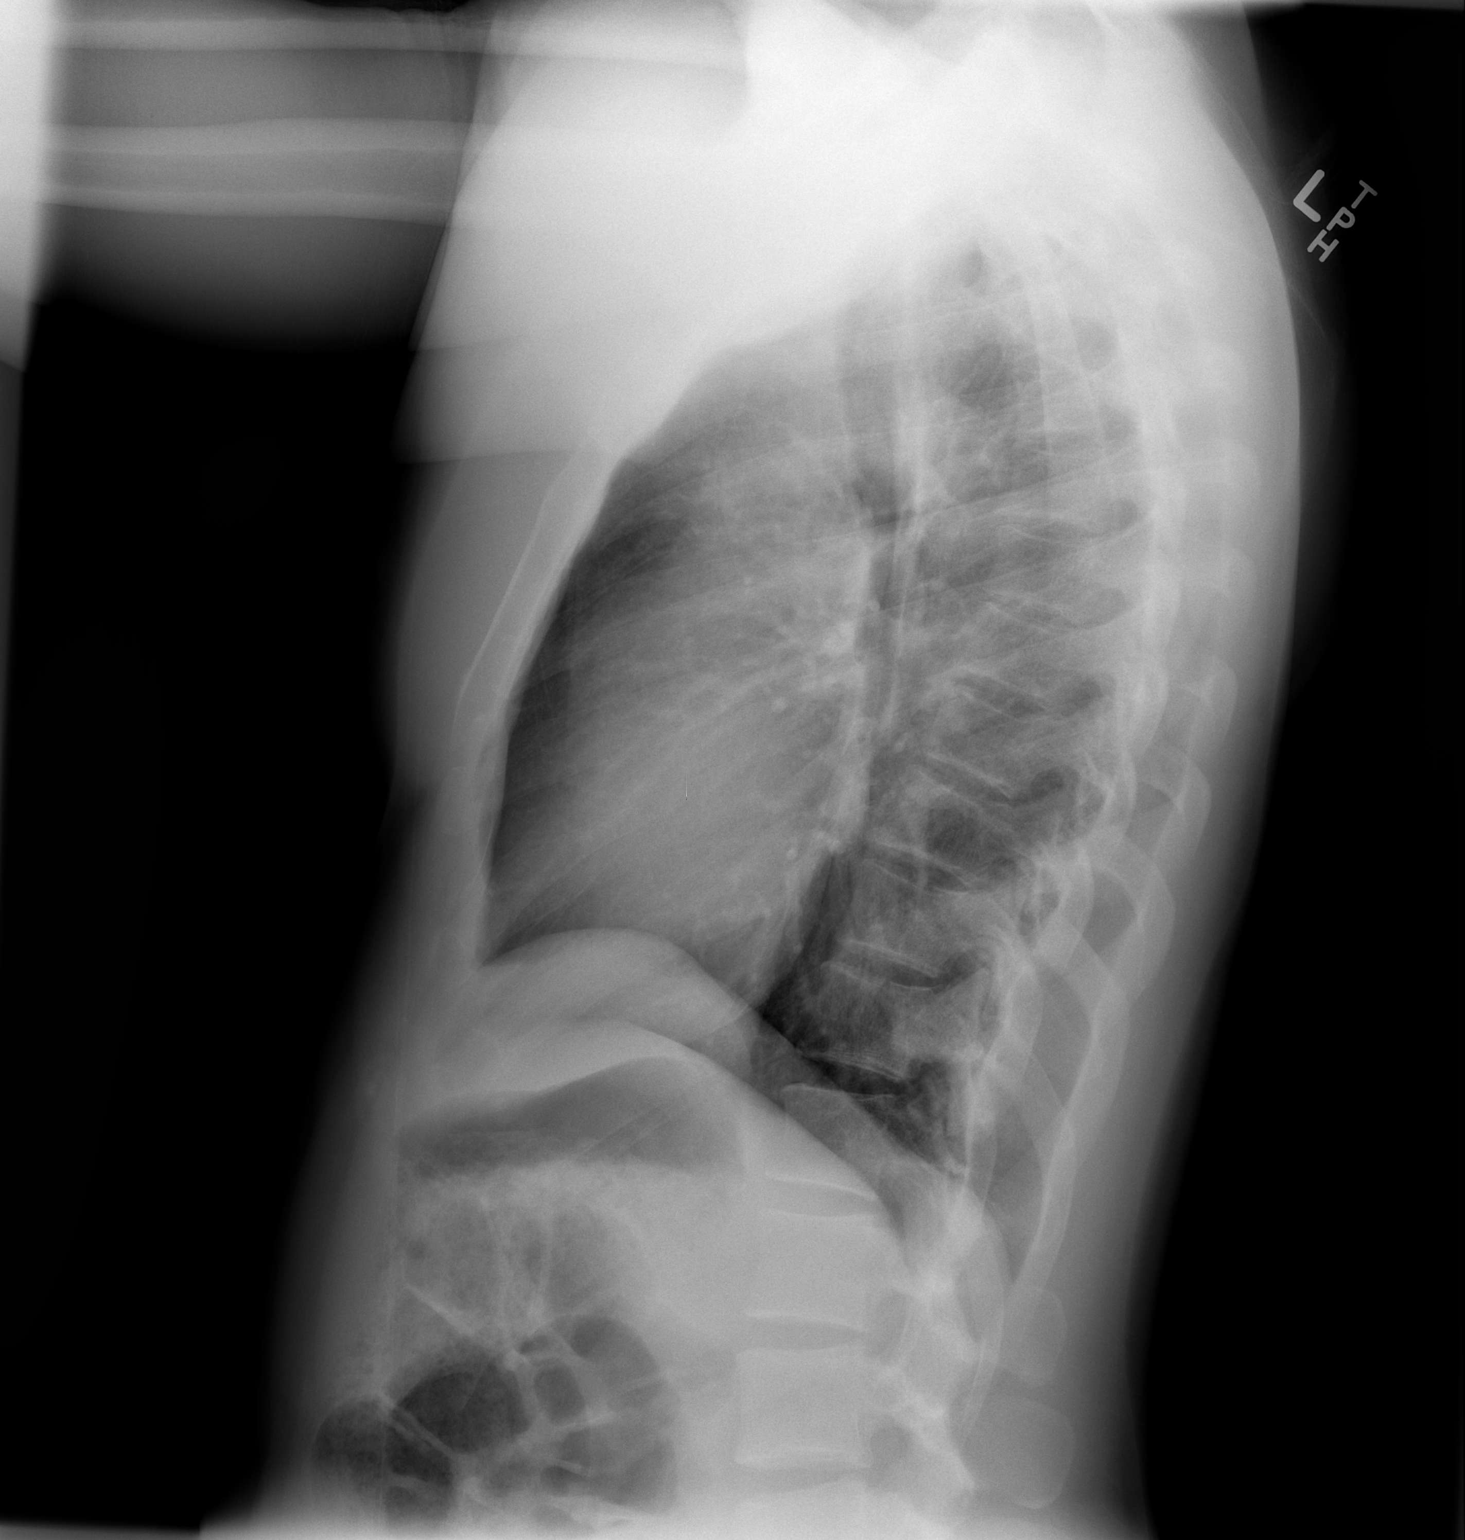

[2 of 2 positions shown; findings below may reference images not displayed]

FINDINGS: The heart size and mediastinal contours are within normal limits.
Both lungs are clear. The visualized skeletal structures are
unremarkable.

No pneumothorax.
IMPRESSION: No active cardiopulmonary disease.
